# Patient Record
Sex: Female | Born: 1985 | Race: White | Marital: Single
Health system: Northeastern US, Academic
[De-identification: ages and names within clinical notes are randomized; demographics above are authoritative.]

---

## 2016-05-04 ENCOUNTER — Ambulatory Visit (HOSPITAL_BASED_OUTPATIENT_CLINIC_OR_DEPARTMENT_OTHER): Admitting: Family Medicine

## 2016-05-04 NOTE — Progress Notes (Signed)
* * *        **  Gina Case**    ------    5 Y old Female, DOB: May 11, 1986    [address] [place], [place] [address]    Home: [phone]    Provider: Alfonso Ramus        * * *    Telephone Encounter    ---    Answered by    Theresa Duty    Date: 05/04/2016        Time: 03:45 PM    Caller    Pt    ------            Reason    returning            Message                      Returning patient. pt will get prior records and update PCP w/[facility]. pt will pick up new pt records at [facility]. left practice before due to insurance issues. not taking any meds. has some issues w/mentstrual cycle. was going to [place]. wants to return now that her insurance is straightened out.                 Action Taken    Sorn,Sokha 05/04/2016 3:49:23 PM > new pt appt on 05-14-16  Gabino Hagin B 05/04/2016 9:16:54 PM > Noted                * * *                ---          * * *          Patient: Case, Gina R DOB: September 25, 1985 Provider: Peggye Form B  05/04/2016    ---    Note generated by eClinicalWorks EMR/PM Software ([email or web address])

## 2016-05-07 ENCOUNTER — Ambulatory Visit: Admitting: Family

## 2016-05-07 LAB — HX TSH REFLEX PANEL (RECOMMENDED)
CASE NUMBER: 2017299001415
HX 3RD GEN TSH: 0.595 u[IU]/mL — NL (ref 0.358–3.74)

## 2016-05-08 ENCOUNTER — Ambulatory Visit: Admitting: Family

## 2016-05-14 ENCOUNTER — Ambulatory Visit (HOSPITAL_BASED_OUTPATIENT_CLINIC_OR_DEPARTMENT_OTHER): Admitting: Family Medicine

## 2016-05-14 NOTE — Progress Notes (Signed)
* * *        **Gina Case, Gina Case**    ------    19 Y old Female, DOB: [birth year]    Account Number: [id number]    [address] [place], [place]    Home: [phone]    Guarantor: [employer] Insurance: [employer] - [facility]  [facility] Payer ID: [id number]    External Visit ID: [id number]    Appointment Facility: [facility]        * * *    05/14/2016    Progress Notes: Sadi Arave    ------    ---        **Current Medications**    ---    Discontinued      * Ocella     ---    * Flexeril     ---    * Vicoprofen     ---    * naproxen 500 mg tablet 1 tab(s) orally 2 times a day     ---    * amitriptyline 25 mg tablet 1 tabs orally qhs     ---    * Medication List reviewed and reconciled with the patient     ---      **Past Medical History**    ---      Healthy    ---      **Surgical History**    ---      wisdom teeth    ---      **Family History**    ---      Father: alive    ---    Mother: alive    ---    Sibling 1: alive, brother    ---    1 brother(s) - healthy.    ---      **Social History**    ---    no Smoking  Status: never smoker  , Patient counseled on the dangers of  tobacco use: 05/14/2016.    Seat belt: yes.    Alcohol: socially.    no Drug use.    Exercise: yes.    Marital Status: single.    Children: none.    Occupation: in school, works PT.    Home smoke detector use: yes.    Sexual orientation: heterosexual.    Sexually active: yes.      **Gyn History**    ---    Date of Last Period 3-4 months ago, bled every day sonce that time.      **Allergies**    ---      N.K.D.A.    ---      **Hospitalization/Major Diagnostic Procedure**    ---      No Hospitalization History.    ---      **Review of Systems**    ---    _CONSTITUTIONAL_ :    no Fever. no Chills. no Loss of Appetite.    _OPTHALMOLOGY_ :    no Change in Vision.    _RESPIRATORY_ :    no Shortness of Breath. no DOE (dyspnea on exertion).    _CARDIOLOGY_ :    no Chest Pain.    _GASTROENTEROLOGY_ :    no  Abdominal Pain. no Nausea. no Vomiting.    _FEMALE REPRODUCTIVE_ :    Abnormal vaginal bleeding yes.    _MUSCULOSKELETAL_ :    Positive for  neck pain  .    _NEUROLOGY_ :    no Headaches. no Weakness. no Tingling/Numbness. no Speech Abnormality.  _DERMATOLOGY_ :    no Rash.            **Reason for Appointment**    ---      1\. Returning pt. pt aware that if records are not received prior to appt,  it will get canceled.    ---      **History of Present Illness**    ---    _Sick Visit_ :    Patient is a 30 year old female returning patient presents with neck pain.  Ongoing for few years worsening in the last 6 months. Nothing make it worse.  Cracking make it better. Tight. Down to the shoulder more on the left.  Stretching make it better. Heat pads and Ibuprofen helps sometimes. Denies  numbness and tingling of the upper extremities except with the tip of the  right 4th finger when it get cold.Denies stress more than usual. Works as  Leisure centre manager.    Patient reports of continuous vaginal bleeding in the last 4 months. Went to  [facility] and had U/S done. Got thryoid checked as well.      **Vital Signs**    ---    HR 86, BP 118/72, O2 Sat. 99, Wt 182, Wt Change 52 lb.      **Examination**    ---    Denton Meek Examination_ :    General Appearance:  alert and oriented  NAD  .    HEENT:  moist mucous membranes  PERRLA  EOMI  .    Neck, Thyroid :  Mild tenderness on the back of the neck and the side towards  the trapezius muscle.  Marland Kitchen    Heart:  RRR, normal S1S2  no murmurs  .    Lungs:  clear to auscultation bilaterally  no wheezes/rhonchi/rales  .    Extremities  no edema  .    Skin:  no rash or skin lesions  .    Neurologic Exam:  No focal neurologic deficit  .          **Assessments**    ---    1\. 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 Abnormal uterine bleeding - N93.9    ---    3\. Left ovarian cyst - N83.20    ---      **Treatment**    ---      **1\. Chronic neck pain**    Notes: Likely muscle  spasm. Declines muscle relaxant. Moist heat. NSAID.  Stretching exercises- hand out given. If not better will refer to physical  therapist. Patient Educated with: Neck Spasms.pdf (Neck Spasms.pdf).    ---        **2\. Abnormal uterine bleeding**    Notes: She will f/u with [facility]. The ultrasound showed normal uterus  likely hormonal imbalance. Thyroid test is normal.        **3\. Left ovarian cyst**    Notes: Septated cystic lesion in left ovary seen on US done on 05/08/16. Will  repeat in 8-12 weeks.      **Follow Up**    ---    prn    Electronically signed by Peggye Form on 05/14/2016 at 07:57 PM EDT    Sign off status: Completed        * * [facility]    [address]    [place], [place] [address]    Tel: [phone]    Fax: [phone]              * * *  Patient: Gina Case, Gina Case DOB: April 20, 1986 Progress Note: Ewan Grau  05/14/2016    ---    Note generated by eClinicalWorks EMR/PM Software ([email or web address])

## 2016-05-18 ENCOUNTER — Ambulatory Visit

## 2016-06-18 ENCOUNTER — Ambulatory Visit: Admitting: Family

## 2016-06-22 ENCOUNTER — Ambulatory Visit

## 2016-07-01 ENCOUNTER — Ambulatory Visit

## 2016-07-09 ENCOUNTER — Ambulatory Visit (HOSPITAL_BASED_OUTPATIENT_CLINIC_OR_DEPARTMENT_OTHER): Admitting: Family Medicine

## 2016-07-09 ENCOUNTER — Encounter

## 2016-07-09 NOTE — Progress Notes (Signed)
* * *        **  Awilda Metro**    ------    44 Y old Female, DOB: [birth year]    [address] [place], [place] [address]    Home: [phone]    Provider: Alfonso Ramus        * * *    Telephone Encounter    ---    Answered by    Sharyn Dross    Date: 07/09/2016        Time: 04:28 PM    Reason    medical records    ------            Action Taken    Marion-Hussey,Laura 07/09/2016 4:41:11 PM > Entered info to  VV from previous records.                * * *              * * *        ---        Reason for Appointment    ---      1\. Medical records    ---      Current Medications    ---      None    ---      Past Medical History    ---      Right distal tib-fib fracture 2016    ---    LGSIL 04/30/2015    ---      Gyn History    ---    Sexual activity currently sexually active.    Last pap smear date 03/03/2016 Negative.    Abnormal pap smear LGSIL 2016- colpo 06/2015- no diagnostic abnormality  recognized.    Date of Last Period 3-4 months ago, bled every day sonce that time.    Birth control vasectomy by partner.      OB History    ---    Total pregnancies 2\.    Total living children 0\.    Abortion(s) 2\.    Pregnancy # 1: D&C.    Pregnancy # 2: D&C.          * * *          Patient: AILIN, ROCHFORD DOB: 05-Jun-1986 Provider: Peggye Form B  07/09/2016    ---    Note generated by eClinicalWorks EMR/PM Software ([email or web address])

## 2016-09-28 ENCOUNTER — Ambulatory Visit (HOSPITAL_BASED_OUTPATIENT_CLINIC_OR_DEPARTMENT_OTHER): Admitting: Family Medicine

## 2016-09-28 NOTE — Progress Notes (Signed)
* * *        **  Awilda Metro**    ------    46 Y old Female, DOB: [birth year]    [address], [place], [place] [address]    Home: [phone]    Provider: Alfonso Ramus        * * *    Telephone Encounter    ---    Answered by    Peggye Form B    Date: 09/28/2016        Time: 09:45 AM    Reason    LGSIL-04/29/15    ------            Action Taken    Braydee Shimkus B 09/28/2016 9:45:30 AM > Colposcopy done  06/09/15- Normal. Rpt pap 03/02/16- normal                * * *                ---          * * *          Patient: Sokolow, Belina R DOB: Dec 05, 1985 Provider: Peggye Form B  09/28/2016    ---    Note generated by eClinicalWorks EMR/PM Software ([email or web address])

## 2016-11-04 ENCOUNTER — Ambulatory Visit

## 2017-02-18 ENCOUNTER — Encounter (HOSPITAL_BASED_OUTPATIENT_CLINIC_OR_DEPARTMENT_OTHER): Payer: Self-pay | Admitting: Emergency Medicine

## 2017-02-18 ENCOUNTER — Emergency Department (HOSPITAL_BASED_OUTPATIENT_CLINIC_OR_DEPARTMENT_OTHER)
Admission: EM | Admit: 2017-02-18 | Discharge: 2017-02-18 | Disposition: A | Discharge status: Home / Self Care | Payer: BLUE CROSS/BLUE SHIELD | Attending: Emergency Medicine | Admitting: Emergency Medicine

## 2017-02-18 DIAGNOSIS — R140 Abdominal distension (gaseous): Secondary | ICD-10-CM | POA: Diagnosis not present

## 2017-02-18 LAB — URINALYSIS, ROUTINE W REFLEX MICROSCOPIC
BILIRUBIN URINE: NEGATIVE
GLUCOSE, UA: NEGATIVE mg/dL
HGB URINE DIPSTICK: NEGATIVE
Ketones, ur: NEGATIVE mg/dL
Nitrite: NEGATIVE
PH: 5 (ref 5.0–8.0)
Protein, ur: NEGATIVE mg/dL
Specific Gravity, Urine: 1.025 (ref 1.005–1.030)

## 2017-02-18 LAB — URINALYSIS, MICROSCOPIC (REFLEX)

## 2017-02-18 LAB — PREGNANCY, URINE: Preg Test, Ur: NEGATIVE

## 2017-02-18 MED ORDER — GI COCKTAIL ~~LOC~~
30.0000 mL | Freq: Once | ORAL | Status: AC
  Administered 2017-02-18: 30 mL via ORAL
  Filled 2017-02-18: qty 30

## 2017-02-18 NOTE — ED Triage Notes (Signed)
Pt reports abd pain, feeling bloated, burping a lot today. Pt reports 3 BM today which relieves the pain for a short time.

## 2017-02-18 NOTE — ED Notes (Signed)
ED Provider at bedside. 

## 2017-02-18 NOTE — ED Provider Notes (Signed)
[facility]   CSN: [id number] Arrival date & time: 02/18/17  1714     History   Chief Complaint Chief Complaint  Patient presents with  . Bloated    HPI Anna Mayer is a 31 y.o. female without significant PMHx, Presenting with acute onset of abdominal bloating that began this morning. She states she has been belching and passing gas throughout the day. She states she has been having normal bowel movements, 3 times today. She denies abdominal pain, N/V, D/C fever, heartburn, urinary symptoms, vaginal bleeding or discharge, blood in stool. LMP July 27. No history of abdominal surgery.  The history is provided by the patient.    History reviewed. No pertinent past medical history.  There are no active problems to display for this patient.   History reviewed. No pertinent surgical history.  OB History    No data available       Home Medications    Prior to Admission medications   Not on File    Family History No family history on file.  Social History Social History  Substance Use Topics  . Smoking status: Never Smoker  . Smokeless tobacco: Never Used  . Alcohol use No     Allergies   Penicillins   Review of Systems Review of Systems  Constitutional: Negative for appetite change, chills and fever.  HENT: Negative for trouble swallowing.   Respiratory: Negative for choking and shortness of breath.   Gastrointestinal: Positive for abdominal distention. Negative for abdominal pain, blood in stool, constipation, diarrhea, nausea and vomiting.  Genitourinary: Negative for dysuria, frequency, vaginal bleeding and vaginal discharge.  Musculoskeletal: Negative for back pain.  Skin: Negative for color change.  All other systems reviewed and are negative.    Physical Exam Updated Vital Signs BP (!) 140/98 (BP Location: Right Arm)   Pulse 66   Temp 99.1 F (37.3 C) (Oral)   Resp 16   Ht 5\' 2"  (1.575 m)   Wt 114.3 kg (252 lb)    LMP 02/05/2017   SpO2 100%   BMI 46.09 kg/m   Physical Exam  Constitutional: She appears well-developed and well-nourished. No distress.  Morbidly obese. Well-appearing.  HENT:  Head: Normocephalic and atraumatic.  Mouth/Throat: Oropharynx is clear and moist.  Eyes: Conjunctivae are normal.  Neck: Normal range of motion.  Cardiovascular: Normal rate, regular rhythm, normal heart sounds and intact distal pulses.   Pulmonary/Chest: Effort normal and breath sounds normal.  Abdominal: Soft. Bowel sounds are normal. She exhibits no mass. There is no tenderness. There is no rigidity, no rebound, no guarding and no CVA tenderness. No hernia.  Difficult to assess distention due to patient's body habitus.  Neurological: She is alert.  Skin: Skin is warm.  Psychiatric: She has a normal mood and affect. Her behavior is normal.  Nursing note and vitals reviewed.    ED Treatments / Results  Labs (all labs ordered are listed, but only abnormal results are displayed) Labs Reviewed  URINALYSIS, ROUTINE W REFLEX MICROSCOPIC - Abnormal; Notable for the following:       Result Value   APPearance CLOUDY (*)    Leukocytes, UA SMALL (*)    All other components within normal limits  URINALYSIS, MICROSCOPIC (REFLEX) - Abnormal; Notable for the following:    Bacteria, UA MANY (*)    Squamous Epithelial / LPF 0-5 (*)    All other components within normal limits  PREGNANCY, URINE    EKG  EKG Interpretation  None       Radiology No results found.  Procedures Procedures (including critical care time)  Medications Ordered in ED Medications  gi cocktail (Maalox,Lidocaine,Donnatal) (30 mLs Oral Given 02/18/17 1955)     Initial Impression / Assessment and Plan / ED Course  I have reviewed the triage vital signs and the nursing notes.  Pertinent labs & imaging results that were available during my care of the patient were reviewed by me and considered in my medical decision making (see  chart for details).     Patient presenting with abdominal bloating and associated belching/flatulence. Patient is nontoxic, nonseptic appearing, in no apparent distress. Abdominal exam is benign; no tenderness or peritoneal signs. No pelvic complaints. GI cocktail given in ED.  Patient discharged home with symptomatic treatment and given instructions for follow-up with their primary care physician. Return precautions discussed. Pt safe for discharge.  Discussed results, findings, treatment and follow up. Patient advised of return precautions. Patient verbalized understanding and agreed with plan.  Final Clinical Impressions(s) / ED Diagnoses   Final diagnoses:  Abdominal bloating    New Prescriptions New Prescriptions   No medications on file     Russo, [place] N, PA-C 02/18/17 2004    Tilden Fossa, MD 02/19/17 0111

## 2017-02-18 NOTE — ED Notes (Signed)
Pt verbalizes understanding of d/c instructions and denies any further needs at this time. 

## 2017-02-18 NOTE — Discharge Instructions (Signed)
Please read instructions below.  Begin taking Zantac every 12 hours as needed for abdominal discomfort.  Schedule an appointment with your primary care provider if symptoms persist. Return to the ER for fever, abdominal pain, if you stop having bowel movements, or for new or concerning symptoms.

## 2017-06-02 ENCOUNTER — Emergency Department (HOSPITAL_BASED_OUTPATIENT_CLINIC_OR_DEPARTMENT_OTHER)
Admission: EM | Admit: 2017-06-02 | Discharge: 2017-06-03 | Disposition: A | Discharge status: Home / Self Care | Payer: BLUE CROSS/BLUE SHIELD | Attending: Emergency Medicine | Admitting: Emergency Medicine

## 2017-06-02 ENCOUNTER — Other Ambulatory Visit: Payer: Self-pay

## 2017-06-02 ENCOUNTER — Encounter: Payer: Self-pay | Admitting: Emergency Medicine

## 2017-06-02 DIAGNOSIS — H00012 Hordeolum externum right lower eyelid: Secondary | ICD-10-CM | POA: Insufficient documentation

## 2017-06-02 DIAGNOSIS — H02842 Edema of right lower eyelid: Secondary | ICD-10-CM | POA: Diagnosis present

## 2017-06-02 NOTE — ED Notes (Signed)
Rt lower eye lid swelling and soreness onset Monday denies inj  No diff w vision

## 2017-06-02 NOTE — ED Triage Notes (Addendum)
PResents with right lower eyelid swelling and redness that began Monday with slight swelling and went down after taking benadryl and eye drops. The swelling became worse this AM. SHe has been using warm wash cloths which helped with the soreness but not the swelling. Denies fevers, blurry vsion and headache. Deniies eye drainage. C/o lower eyelid soreness. EOMs intact, no loss of vision.

## 2017-06-03 MED ORDER — CEPHALEXIN 500 MG PO CAPS: 500.0000 mg | ORAL_CAPSULE | Freq: Four times a day (QID) | ORAL | 0 refills | Status: DC

## 2017-06-03 MED ORDER — CEPHALEXIN 250 MG PO CAPS
500.0000 mg | ORAL_CAPSULE | Freq: Once | ORAL | Status: AC
  Administered 2017-06-03: 500 mg via ORAL
  Filled 2017-06-03: qty 2

## 2017-06-03 NOTE — Discharge Instructions (Signed)
Please continue warm compresses.  You may alternate Tylenol 1000 mg every 6 hours as needed for pain and Ibuprofen 800 mg every 8 hours as needed for pain.  Please take Ibuprofen with food.

## 2017-06-03 NOTE — ED Provider Notes (Signed)
TIME SEEN: 12:21 AM  CHIEF COMPLAINT: Right lower eyelid swelling  HPI: Patient is a 31 year old female with no significant past medical history who presents to the emergency department with right lower eyelid swelling for the past several days.  Has tried warm compresses without relief.  No drainage.  No vision changes, eye pain, redness, drainage.  Has had increased hearing.  Does not wear contacts or glasses.  Does not have an ophthalmologist or optometrist.  No injury to the face.  No pain with movement of the eyes.  ROS: See HPI Constitutional: no fever  Eyes: no drainage  ENT: no runny nose   Cardiovascular:  no chest pain  Resp: no SOB  GI: no vomiting GU: no dysuria Integumentary: no rash  Allergy: no hives  Musculoskeletal: no leg swelling  Neurological: no slurred speech ROS otherwise negative  PAST MEDICAL HISTORY/PAST SURGICAL HISTORY:  History reviewed. No pertinent past medical history.  MEDICATIONS:  Prior to Admission medications   Not on File    ALLERGIES:  Allergies  Allergen Reactions  . Penicillins     SOCIAL HISTORY:  Social History   Tobacco Use  . Smoking status: Never Smoker  . Smokeless tobacco: Never Used  Substance Use Topics  . Alcohol use: No    FAMILY HISTORY: History reviewed. No pertinent family history.  EXAM: BP 121/71 (BP Location: Left Arm)   Pulse 80   Temp 98.4 F (36.9 C) (Oral)   Resp 18   LMP 05/13/2017 (Approximate)   SpO2 100%  CONSTITUTIONAL: Alert and oriented and responds appropriately to questions. Well-appearing; well-nourished HEAD: Normocephalic EYES: Conjunctivae clear, pupils appear equal, EOMI, normal visual acuity, no drainage, patient has a stye to the inner aspect of the right lower eyelid with some dependent edema and small amount of erythema underneath the eyelid, no drainage of the stye, no hyphema, no subconjunctival hemorrhage, globe appears intact ENT: normal nose; moist mucous membranes NECK:  Supple, no meningismus, no nuchal rigidity, no LAD  CARD: RRR; S1 and S2 appreciated; no murmurs, no clicks, no rubs, no gallops RESP: Normal chest excursion without splinting or tachypnea; breath sounds clear and equal bilaterally; no wheezes, no rhonchi, no rales, no hypoxia or respiratory distress, speaking full sentences ABD/GI: Normal bowel sounds; non-distended; soft, non-tender, no rebound, no guarding, no peritoneal signs, no hepatosplenomegaly BACK:  The back appears normal and is non-tender to palpation, there is no CVA tenderness EXT: Normal ROM in all joints; non-tender to palpation; no edema; normal capillary refill; no cyanosis, no calf tenderness or swelling    SKIN: Normal color for age and race; warm; no rash NEURO: Moves all extremities equally PSYCH: The patient's mood and manner are appropriate. Grooming and personal hygiene are appropriate.  MEDICAL DECISION MAKING: Patient here with a stye to the right lower eyelid.  Have recommended continuing warm compresses and alternating Tylenol Motrin.  The eye itself is not involved.  Given she does have some lower erythema and warmth underneath the margin of the eyelid consistent with some possible very mild cellulitis versus just dependent changes from gravity, will discharge on short course of Keflex.  Doubt post septal cellulitis.  Given ophthalmology follow-up.  Discussed return precautions.  At this time, I do not feel there is any life-threatening condition present. I have reviewed and discussed all results (EKG, imaging, lab, urine as appropriate) and exam findings with patient/family. I have reviewed nursing notes and appropriate previous records.  I feel the patient is safe to be  discharged home without further emergent workup and can continue workup as an outpatient as needed. Discussed usual and customary return precautions. Patient/family verbalize understanding and are comfortable with this plan.  Outpatient follow-up has been  provided if needed. All questions have been answered.      Shundra Wirsing, Layla MawKristen N, DO 06/03/17 [id number]

## 2017-06-03 NOTE — ED Notes (Signed)
Pt verbalizes understanding of d/c instructions and denies any further needs at this time. 

## 2017-11-25 ENCOUNTER — Emergency Department (HOSPITAL_BASED_OUTPATIENT_CLINIC_OR_DEPARTMENT_OTHER)
Admission: EM | Admit: 2017-11-25 | Discharge: 2017-11-25 | Disposition: A | Discharge status: Home / Self Care | Payer: BLUE CROSS/BLUE SHIELD | Attending: Emergency Medicine | Admitting: Emergency Medicine

## 2017-11-25 ENCOUNTER — Encounter (HOSPITAL_BASED_OUTPATIENT_CLINIC_OR_DEPARTMENT_OTHER): Payer: Self-pay | Admitting: Adult Health

## 2017-11-25 ENCOUNTER — Other Ambulatory Visit: Payer: Self-pay

## 2017-11-25 DIAGNOSIS — R102 Pelvic and perineal pain unspecified side: Secondary | ICD-10-CM

## 2017-11-25 DIAGNOSIS — Z79899 Other long term (current) drug therapy: Secondary | ICD-10-CM | POA: Insufficient documentation

## 2017-11-25 LAB — WET PREP, GENITAL
Clue Cells Wet Prep HPF POC: NONE SEEN
SPERM: NONE SEEN
TRICH WET PREP: NONE SEEN
Yeast Wet Prep HPF POC: NONE SEEN

## 2017-11-25 LAB — URINALYSIS, ROUTINE W REFLEX MICROSCOPIC
Bilirubin Urine: NEGATIVE
Glucose, UA: NEGATIVE mg/dL
Ketones, ur: NEGATIVE mg/dL
Leukocytes, UA: NEGATIVE
Nitrite: NEGATIVE
PROTEIN: NEGATIVE mg/dL
pH: 5.5 (ref 5.0–8.0)

## 2017-11-25 LAB — URINALYSIS, MICROSCOPIC (REFLEX)

## 2017-11-25 LAB — PREGNANCY, URINE: PREG TEST UR: NEGATIVE

## 2017-11-25 MED ORDER — AZITHROMYCIN 250 MG PO TABS
500.0000 mg | ORAL_TABLET | Freq: Once | ORAL | Status: AC
  Administered 2017-11-25: 500 mg via ORAL
  Filled 2017-11-25: qty 2

## 2017-11-25 MED ORDER — GENTAMICIN SULFATE 40 MG/ML IJ SOLN
240.0000 mg | Freq: Once | INTRAMUSCULAR | Status: DC
  Filled 2017-11-25: qty 6

## 2017-11-25 NOTE — Discharge Instructions (Addendum)
You have results pending.  If they are positive, you receive a phone call.  If they are negative, you will not receive a phone call.  Either way, the results will be posted in MyChart.  If results are positive for gonorrhea, you will need to get treatment. If they are positive only for chlamydia, you do not need further treatment, as you were treated today. Make sure you are drinking water and staying well-hydrated. Using creams, douches, or otherwise altering the environment of your vagina, as this can cause worsening symptoms. Follow up with women's health or the health department for further evaluation if your symptoms are not improving. Return to the emergency room if you develop high fevers, vomiting, persistent abdominal pain, or any new or concerning symptoms.

## 2017-11-25 NOTE — ED Provider Notes (Signed)
[facility] EMERGENCY DEPARTMENT Provider Note   CSN: [id number] Arrival date & time: 11/25/17  1331     History   Chief Complaint Chief Complaint  Patient presents with  . Vaginal Pain    HPI Anna Mayer is a 32 y.o. female presenting for evaluation of vaginal pain.  Patient states she has had 4 days of vaginal irritation.  This is worse when she urinates and when she ambulates.  She denies history of similar.  She denies vaginal discharge.  She reports a fever of 102 yesterday, which resolved with 1 dose of Motrin and has not returned.  She denies chest pain, shortness breath, nausea, vomiting, abdominal pain.  She denies dysuria or hematuria.  She is sexually active with one female partner, they use condoms.  She is not on other birth control.  She does not have an OB/GYN, nor ever had a pelvic exam. Period ended 2 days ago.  She has no medical problems, does not take medications daily.  HPI  History reviewed. No pertinent past medical history.  There are no active problems to display for this patient.   History reviewed. No pertinent surgical history.   OB History   None      Home Medications    Prior to Admission medications   Medication Sig Start Date End Date Taking? Authorizing Provider  cephALEXin (KEFLEX) 500 MG capsule Take 1 capsule (500 mg total) by mouth 4 (four) times daily. 06/03/17   Ward, Layla Maw, DO    Family History History reviewed. No pertinent family history.  Social History Social History   Tobacco Use  . Smoking status: Never Smoker  . Smokeless tobacco: Never Used  Substance Use Topics  . Alcohol use: No  . Drug use: No     Allergies   Penicillins   Review of Systems Review of Systems  Genitourinary: Positive for vaginal pain.  All other systems reviewed and are negative.    Physical Exam Updated Vital Signs BP (!) 129/95 (BP Location: Right Arm)   Pulse 81   Temp (!) 97.5 F (36.4 C) (Oral)   Resp 18    Ht  (1.575 m)   Wt 120.2 kg (265 lb)   LMP 11/11/2017   SpO2 100%   BMI 48.47 kg/m   Physical Exam  Constitutional: She is oriented to person, place, and time. She appears well-developed and well-nourished. No distress.  Resting comfortably in no distress  HENT:  Head: Normocephalic and atraumatic.  Eyes: Pupils are equal, round, and reactive to light. Conjunctivae and EOM are normal.  Neck: Normal range of motion. Neck supple.  Cardiovascular: Normal rate, regular rhythm and intact distal pulses.  Pulmonary/Chest: Effort normal and breath sounds normal. No respiratory distress. She has no wheezes.  Abdominal: Soft. She exhibits no distension and no mass. There is no tenderness. There is no guarding.  Genitourinary: Rectum normal, vagina normal and uterus normal. Pelvic exam was performed with patient supine. There is no rash, tenderness, lesion or injury on the right labia. There is no rash, tenderness, lesion or injury on the left labia. Cervix exhibits discharge. Cervix exhibits no motion tenderness and no friability. Right adnexum displays no mass, no tenderness and no fullness. Left adnexum displays no mass, no tenderness and no fullness.  Genitourinary Comments: Chaperone present.  No obvious lesions.  Thin discharge on exam.  No CMT or adnexal tenderness.  Musculoskeletal: Normal range of motion.  Neurological: She is alert and oriented to person,  place, and time.  Skin: Skin is warm and dry.  Psychiatric: She has a normal mood and affect.  Nursing note and vitals reviewed.    ED Treatments / Results  Labs (all labs ordered are listed, but only abnormal results are displayed) Labs Reviewed  WET PREP, GENITAL - Abnormal; Notable for the following components:      Result Value   WBC, Wet Prep HPF POC MANY (*)    All other components within normal limits  URINALYSIS, ROUTINE W REFLEX MICROSCOPIC - Abnormal; Notable for the following components:   APPearance TURBID (*)      Specific Gravity, Urine >1.030 (*)    Hgb urine dipstick MODERATE (*)    All other components within normal limits  URINALYSIS, MICROSCOPIC (REFLEX) - Abnormal; Notable for the following components:   Bacteria, UA RARE (*)    All other components within normal limits  PREGNANCY, URINE  GC/CHLAMYDIA PROBE AMP ([place]) NOT AT [facility]    EKG None  Radiology No results found.  Procedures Procedures (including critical care time)  Medications Ordered in ED Medications  azithromycin (ZITHROMAX) tablet 500 mg (500 mg Oral Given 11/25/17 1608)     Initial Impression / Assessment and Plan / ED Course  I have reviewed the triage vital signs and the nursing notes.  Pertinent labs & imaging results that were available during my care of the patient were reviewed by me and considered in my medical decision making (see chart for details).     Pt presenting for evaluation of vaginal irritation.  UA without infection.  GU exam shows discharge without adnexal tenderness or CMT.  Doubt PID.  Wet prep positive for white cells without yeast, trichomoniasis, or clue cells.  Gonorrhea and chlamydia sent.  Discussed findings with patient.  Patient elects for treatment for possible STD, however reports an anaphylactic-like reaction to penicillins.  Will give azithromycin and hold off on Rocephin at this time until results have returned.  Discussed with patient that if gonorrhea is positive, she will need treatment.  Information given for follow-up with health department or women's health.  At this time, patient appears safe for discharge.  Return precautions given.  Patient states she understands and agrees to plan.   Final Clinical Impressions(s) / ED Diagnoses   Final diagnoses:  Vaginal pain    ED Discharge Orders    None       Alveria Apley, PA-C 11/25/17 1640    Tegeler, Canary Brim, MD 11/25/17 [phone]

## 2017-11-25 NOTE — ED Triage Notes (Signed)
PResents with "vaginal rash that burns when I urinate and blisters to my rectum, it also hurts when I walk a little" Denies unprotected sexual activity. LAst menstrual period 2 weeks ago.

## 2017-11-26 LAB — GC/CHLAMYDIA PROBE AMP (~~LOC~~) NOT AT ARMC
CHLAMYDIA, DNA PROBE: NEGATIVE
Neisseria Gonorrhea: NEGATIVE

## 2017-11-30 ENCOUNTER — Encounter (HOSPITAL_BASED_OUTPATIENT_CLINIC_OR_DEPARTMENT_OTHER): Payer: Self-pay | Admitting: *Deleted

## 2017-11-30 ENCOUNTER — Emergency Department (HOSPITAL_BASED_OUTPATIENT_CLINIC_OR_DEPARTMENT_OTHER)
Admission: EM | Admit: 2017-11-30 | Discharge: 2017-11-30 | Disposition: A | Discharge status: Home / Self Care | Payer: BLUE CROSS/BLUE SHIELD | Attending: Emergency Medicine | Admitting: Emergency Medicine

## 2017-11-30 ENCOUNTER — Other Ambulatory Visit: Payer: Self-pay

## 2017-11-30 DIAGNOSIS — R509 Fever, unspecified: Secondary | ICD-10-CM | POA: Insufficient documentation

## 2017-11-30 NOTE — ED Triage Notes (Signed)
Pt reports seen here on 5/16 for dysuria and "burning". Per pt she got 2 pills, but could not get a shot for it here due to her pcn allergy. Pt reports burning and pain has resolved, but she is running fevers up to 103 at night. Pt denies any pain at this time. Given urine cup and instructions for cc urine specimen, will notify staff when able to void.

## 2017-11-30 NOTE — ED Provider Notes (Signed)
[facility] EMERGENCY DEPARTMENT Provider Note   CSN: [id number] Arrival date & time: 11/30/17  4401     History   Chief Complaint Chief Complaint  Patient presents with  . Fever    HPI Anna Mayer is a 32 y.o. female.patient was seen here 11/25/2017 for "vaginal burning "and dysuria. She was treated with azithromycin. She has since become asymptomatic with the exception that she developed a fever with temperatures between 101 and 103three days ago.. She denies chills denies dysuria denies vaginal burning she is presently asymptomatic no abdominal pain no headache no cough no sore throat or chest pain no dysuria and no diarrhea. Treated herself with ibuprofen 5 AM today. She presently feels well. GC and chlamydia specimens were obtained 11/25/2017 which are negative  HPI  History reviewed. No pertinent past medical history.  There are no active problems to display for this patient.   History reviewed. No pertinent surgical history.   OB History   None      Home Medications    Prior to Admission medications   Medication Sig Start Date End Date Taking? Authorizing Provider  cephALEXin (KEFLEX) 500 MG capsule Take 1 capsule (500 mg total) by mouth 4 (four) times daily. 06/03/17   Ward, Layla Maw, DO    Family History History reviewed. No pertinent family history.  Social History Social History   Tobacco Use  . Smoking status: Never Smoker  . Smokeless tobacco: Never Used  Substance Use Topics  . Alcohol use: No  . Drug use: No     Allergies   Penicillins   Review of Systems Review of Systems  Constitutional: Positive for fever.  HENT: Negative.   Respiratory: Negative.   Cardiovascular: Negative.   Gastrointestinal: Negative.   Musculoskeletal: Negative.   Skin: Negative.   Neurological: Negative.   Psychiatric/Behavioral: Negative.   All other systems reviewed and are negative.    Physical Exam Updated Vital Signs BP 123/79    Pulse 80   Temp 99.6 F (37.6 C) (Oral)   Resp 18   Ht  (1.575 m)   Wt 120.2 kg (265 lb)   LMP 11/11/2017   SpO2 100%   BMI 48.47 kg/m   Physical Exam  Constitutional: She appears well-developed and well-nourished. No distress.  HENT:  Head: Normocephalic and atraumatic.  Right Ear: External ear normal.  Left Ear: External ear normal.  Mouth/Throat: Oropharynx is clear and moist.  Bilateral tympanic membranes normal  Eyes: Pupils are equal, round, and reactive to light. Conjunctivae are normal.  Neck: Neck supple. No tracheal deviation present. No thyromegaly present.  Cardiovascular: Normal rate, regular rhythm and normal heart sounds.  No murmur heard. Pulmonary/Chest: Effort normal and breath sounds normal.  Abdominal: Soft. Bowel sounds are normal. She exhibits no distension. There is no tenderness.  obese  Musculoskeletal: Normal range of motion. She exhibits no edema or tenderness.  Neurological: She is alert. Coordination normal.  Skin: Skin is warm and dry. Capillary refill takes less than 2 seconds. No rash noted.  Psychiatric: She has a normal mood and affect.  Nursing note and vitals reviewed.    ED Treatments / Results  Labs (all labs ordered are listed, but only abnormal results are displayed) Labs Reviewed - No data to display  EKG None  Radiology No results found.  Procedures Procedures (including critical care time)  Medications Ordered in ED Medications - No data to display   Initial Impression / Assessment and Plan / ED  Course  I have reviewed the triage vital signs and the nursing notes.  Pertinent labs & imaging results that were available during my care of the patient were reviewed by me and considered in my medical decision making (see chart for details).     Patient well-appearing and asymptomatic. No further diagnostic testing needed. She is in agreement. Plan antipyretics. Home observation. Return precautions given. Referral  primary care Final Clinical Impressions(s) / ED Diagnoses   Final diagnoses:  Fever, unspecified fever cause    ED Discharge Orders    None       Doug Sou, MD 11/30/17 [phone]

## 2017-11-30 NOTE — Discharge Instructions (Signed)
Take Tylenol or ibuprofen as directed for fever higher than 100.4. Is not necessary to wake up at night to check temperature. Return if concern for any reason or see an urgent care center. Call the number on these instructions to get a primary care physician

## 2018-03-28 ENCOUNTER — Encounter (HOSPITAL_BASED_OUTPATIENT_CLINIC_OR_DEPARTMENT_OTHER): Payer: Self-pay | Admitting: Emergency Medicine

## 2018-03-28 ENCOUNTER — Emergency Department (HOSPITAL_BASED_OUTPATIENT_CLINIC_OR_DEPARTMENT_OTHER)
Admission: EM | Admit: 2018-03-28 | Discharge: 2018-03-28 | Disposition: A | Discharge status: Home / Self Care | Payer: BLUE CROSS/BLUE SHIELD | Attending: Emergency Medicine | Admitting: Emergency Medicine

## 2018-03-28 ENCOUNTER — Other Ambulatory Visit: Payer: Self-pay

## 2018-03-28 DIAGNOSIS — R600 Localized edema: Secondary | ICD-10-CM | POA: Diagnosis present

## 2018-03-28 DIAGNOSIS — L03211 Cellulitis of face: Secondary | ICD-10-CM | POA: Diagnosis not present

## 2018-03-28 MED ORDER — SULFAMETHOXAZOLE-TRIMETHOPRIM 800-160 MG PO TABS: 1.0000 | ORAL_TABLET | Freq: Two times a day (BID) | ORAL | 0 refills | Status: AC

## 2018-03-28 NOTE — ED Provider Notes (Signed)
[facility] EMERGENCY DEPARTMENT Provider Note   CSN: [id number] Arrival date & time: 03/28/18  4098     History   Chief Complaint Chief Complaint  Patient presents with  . Skin Problem    HPI Anna Mayer is a 32 y.o. female.  HPI  32 year old female presents with left facial/chin swelling.  Started 2 days ago.  She typically gets increased acne with her menstrual cycles and thought it originally was this but now it has become a little more painful.  The pain is not severe and is mild in intensity.  Has seen a white spot on the bump every once in a while and has tried some warm compresses.  No fevers, trouble breathing, or severe pain. No history of MRSA.  No past medical history on file.  There are no active problems to display for this patient.   No past surgical history on file.   OB History   None      Home Medications    Prior to Admission medications   Not on File    Family History No family history on file.  Social History Social History   Tobacco Use  . Smoking status: Never Smoker  . Smokeless tobacco: Never Used  Substance Use Topics  . Alcohol use: No  . Drug use: No     Allergies   Penicillins   Review of Systems Review of Systems  Constitutional: Negative for fever.  HENT: Positive for facial swelling.   Skin: Negative for wound.     Physical Exam Updated Vital Signs BP 126/66 (BP Location: Right Arm)   Pulse 84   Temp 98.1 F (36.7 C) (Oral)   Resp 18   Ht 5\' 2"  (1.575 m)   Wt 117.9 kg   LMP 03/22/2018   SpO2 99%   BMI 47.55 kg/m   Physical Exam  Constitutional: She appears well-developed and well-nourished. No distress.  HENT:  Head: Normocephalic.    Right Ear: External ear normal.  Left Ear: External ear normal.  Nose: Nose normal.  Eyes: Right eye exhibits no discharge. Left eye exhibits no discharge.  Pulmonary/Chest: Effort normal.  Neurological: She is alert.  Skin: Skin is warm and dry.  She is not diaphoretic.  Nursing note and vitals reviewed.    ED Treatments / Results  Labs (all labs ordered are listed, but only abnormal results are displayed) Labs Reviewed - No data to display  EKG None  Radiology No results found.  Procedures Procedures (including critical care time)  EMERGENCY DEPARTMENT US SOFT TISSUE INTERPRETATION "Study: Limited Soft Tissue Ultrasound"  INDICATIONS: Pain and Soft tissue infection Multiple views of the body part were obtained in real-time with a multi-frequency linear probe  PERFORMED BY: Myself IMAGES ARCHIVED?: Yes SIDE:Left BODY PART:chin INTERPRETATION:  No abcess noted and Cellulitis present   Medications Ordered in ED Medications - No data to display   Initial Impression / Assessment and Plan / ED Course  I have reviewed the triage vital signs and the nursing notes.  Pertinent labs & imaging results that were available during my care of the patient were reviewed by me and considered in my medical decision making (see chart for details).     Patient's area of tenderness could be an ingrown hair.  No significant fluid collection besides a very small possible superficial fluid collection seen on ultrasound.  Otherwise appears to have a little bit of inflammation/cellulitis.  I discussed risks/benefits of needle aspiration versus I&D versus  antibiotics and warm compresses.  We discussed that antibody sometimes cannot penetrate if it is an abscess.  However if it is an abscess is quite small.  Thus we will try antibiotics and warm compresses at her request.  We discussed return precautions.  Final Clinical Impressions(s) / ED Diagnoses   Final diagnoses:  Facial cellulitis    ED Discharge Orders    None       Pricilla LovelessGoldston, Cleona Doubleday, MD 03/28/18 [phone]

## 2018-03-28 NOTE — ED Notes (Signed)
ED Provider at bedside. 

## 2018-03-28 NOTE — ED Triage Notes (Signed)
Pt has swollen area to left side of her chin that started yesterday.  No known insect bite, no tweezing or shaving.  No new products on face.  No itching but is a little sore.

## 2018-03-28 NOTE — Discharge Instructions (Signed)
If the area becomes larger, more painful, or if you develop fevers, vomiting, or any other new/concerning symptoms and return to the ER for evaluation.  Otherwise take the antibiotics and use warm compresses.

## 2018-07-16 ENCOUNTER — Other Ambulatory Visit: Payer: Self-pay

## 2018-07-16 ENCOUNTER — Encounter (HOSPITAL_BASED_OUTPATIENT_CLINIC_OR_DEPARTMENT_OTHER): Payer: Self-pay | Admitting: *Deleted

## 2018-07-16 ENCOUNTER — Emergency Department (HOSPITAL_BASED_OUTPATIENT_CLINIC_OR_DEPARTMENT_OTHER): Payer: BLUE CROSS/BLUE SHIELD

## 2018-07-16 ENCOUNTER — Emergency Department (HOSPITAL_BASED_OUTPATIENT_CLINIC_OR_DEPARTMENT_OTHER)
Admission: EM | Admit: 2018-07-16 | Discharge: 2018-07-16 | Disposition: A | Discharge status: Home / Self Care | Payer: BLUE CROSS/BLUE SHIELD | Attending: Emergency Medicine | Admitting: Emergency Medicine

## 2018-07-16 DIAGNOSIS — M549 Dorsalgia, unspecified: Secondary | ICD-10-CM

## 2018-07-16 DIAGNOSIS — M545 Low back pain: Secondary | ICD-10-CM | POA: Insufficient documentation

## 2018-07-16 DIAGNOSIS — M546 Pain in thoracic spine: Secondary | ICD-10-CM | POA: Insufficient documentation

## 2018-07-16 DIAGNOSIS — Z041 Encounter for examination and observation following transport accident: Secondary | ICD-10-CM | POA: Insufficient documentation

## 2018-07-16 LAB — PREGNANCY, URINE: PREG TEST UR: NEGATIVE

## 2018-07-16 MED ORDER — ACETAMINOPHEN 500 MG PO TABS: 500.0000 mg | ORAL_TABLET | Freq: Four times a day (QID) | ORAL | 0 refills | Status: AC | PRN

## 2018-07-16 MED ORDER — NAPROXEN 500 MG PO TABS: 500.0000 mg | ORAL_TABLET | Freq: Two times a day (BID) | ORAL | 0 refills | Status: AC

## 2018-07-16 MED ORDER — METHOCARBAMOL 500 MG PO TABS: 500.0000 mg | ORAL_TABLET | Freq: Two times a day (BID) | ORAL | 0 refills | Status: AC
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
## 2018-07-16 NOTE — ED Notes (Signed)
Patient is resting comfortably. 
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
## 2019-10-14 ENCOUNTER — Emergency Department: Admit: 2019-10-14 | Disposition: A | Discharge status: Home / Self Care | Source: Home / Self Care

## 2019-12-06 ENCOUNTER — Ambulatory Visit: Admitting: Internal Medicine

## 2019-12-06 LAB — HX .AUTOMATED DIFF
CASE NUMBER: 2021146001167
HX ABSOLUTE BASO COUNT: 0.05 10*3/uL — NL (ref 0.0–0.22)
HX ABSOLUTE EOS COUNT: 0.3 10*3/uL — NL (ref 0.0–0.45)
HX ABSOLUTE LYMPHS COUNT: 1.91 10*3/uL — NL (ref 0.74–5.04)
HX ABSOLUTE MONO COUNT: 0.61 10*3/uL — NL (ref 0.0–1.34)
HX ABSOLUTE NEUTRO COUNT: 4.74 10*3/uL — NL (ref 1.48–7.95)
HX BASOPHILS: 0.7 %
HX EOSINOPHILS: 3.9 %
HX IMMATURE GRANULOCYTES: 0.3 % — NL (ref 0.0–2.0)
HX LYMPHOCYTES: 25 %
HX MONOCYTES: 8 %
HX NEUTROPHILS: 62.1 %

## 2019-12-06 LAB — HX CBC W/ DIFF
CASE NUMBER: 2021146001167
HX ABSOLUTE NRBC COUNT: 0 10*3/uL
HX HCT: 46.5 % — NL (ref 36.0–47.0)
HX HGB: 15.7 g/dL — NL (ref 11.8–16.0)
HX MCH: 31.7 pg — NL (ref 26.0–34.0)
HX MCHC: 33.8 g/dL — NL (ref 31.0–37.0)
HX MCV: 93.8 fL — NL (ref 80.0–100.0)
HX MPV: 10.2 fL — NL (ref 9.4–12.4)
HX NRBC PERCENT: 0 % — NL
HX PLATELET: 258 10*3/uL — NL (ref 150.0–400.0)
HX RBC: 4.96 10*6/uL — NL (ref 3.9–5.2)
HX RDW-CV: 11.8 % — NL (ref 11.5–14.5)
HX RDW-SD: 40.5 fL — NL (ref 35.0–51.0)
HX WBC: 7.6 10*3/uL — NL (ref 3.7–11.2)

## 2019-12-06 LAB — HX GLOMERULAR FILTRATION RATE (ESTIMATED)
CASE NUMBER: 2021146001167
HX AFN AMER GLOMERULAR FILTRATION RATE: >90
HX NON-AFN AMER GLOMERULAR FILTRATION RATE: >90

## 2019-12-06 LAB — HX COMPREHENSIVE METABOLIC PANEL
CASE NUMBER: 2021146001167
HX ALBUMIN LVL: 3.8 g/dL — NL (ref 3.2–5.0)
HX ALKALINE PHOSPHATASE: 88 U/L — NL (ref 30.0–117.0)
HX ALT: 226 U/L — ABNORMAL HIGH (ref 6.0–55.0)
HX ANION GAP: 5 — NL (ref 3.0–11.0)
HX AST: 203 U/L — ABNORMAL HIGH (ref 6.0–40.0)
HX BILIRUBIN TOTAL: 0.6 mg/dL — NL (ref 0.2–1.2)
HX BUN: 16 mg/dL — NL (ref 6.0–20.0)
HX CALCIUM LVL: 9 mg/dL — NL (ref 8.5–10.5)
HX CHLORIDE: 105 mmol/L — NL (ref 98.0–110.0)
HX CO2: 30 mmol/L — NL (ref 21.0–32.0)
HX CREATININE: 0.668 mg/dL — NL (ref 0.55–1.3)
HX GLUCOSE LVL: 105 mg/dL — NL (ref 70.0–110.0)
HX POTASSIUM LVL: 4.4 mmol/L — NL (ref 3.6–5.2)
HX SODIUM LVL: 140 mmol/L — NL (ref 136.0–146.0)
HX TOTAL PROTEIN: 7.3 g/dL — NL (ref 6.0–8.4)

## 2019-12-06 LAB — HX VITAMIN D 25 HYDROXY LEVEL (RECOMMENDED)
CASE NUMBER: 2021146001167
HX VITAMIN D 25 OH LVL: 6 ng/mL — ABNORMAL LOW (ref 30.0–100.0)

## 2019-12-06 LAB — HX URINALYSIS (COMPLETE)
CASE NUMBER: 2021146001168
HX UA BILIRUBIN: NEGATIVE — NL
HX UA BLOOD: NEGATIVE — NL
HX UA GLUCOSE: NEGATIVE — NL
HX UA KETONES: NEGATIVE — NL
HX UA LEUKOCYTE ESTERASE: NEGATIVE — NL
HX UA NITRITE: NEGATIVE — NL
HX UA PH: 5 — NL (ref 5.0–8.0)
HX UA PROTEIN: NEGATIVE — NL
HX UA RBC: 1 /HPF — NL (ref 0.0–2.0)
HX UA SPECIFIC GRAVITY: 1.021 — NL (ref 1.003–1.03)
HX UA SQUAMOUS EPITHELIAL: 8 /HPF — ABNORMAL HIGH (ref 0.0–5.0)
HX UA UROBILINOGEN: NEGATIVE — NL
HX UA WBC: 1 /HPF — NL (ref 0.0–5.0)

## 2019-12-06 LAB — HX LIPID PANEL
CASE NUMBER: 2021146001167
HX CHOL: 249 mg/dL — ABNORMAL HIGH
HX HDL: 60 mg/dL — NL
HX LDL: 163 mg/dL — ABNORMAL HIGH
HX TRIG: 129 mg/dL — NL

## 2019-12-13 ENCOUNTER — Ambulatory Visit: Admitting: Internal Medicine

## 2019-12-15 LAB — HX HPV HIGH RISK BY TMA
CASE NUMBER: 2021153004338
HX HPV HIGH RISK BY TMA: NOT DETECTED

## 2019-12-19 LAB — HX GYN FINAL REPORT
CASE NUMBER: 0
HX FINAL CYTOLOGIC INTERPRETATION: NEGATIVE
HX STATEMENT OF ADEQUACY: ABSENT

## 2020-10-03 ENCOUNTER — Emergency Department (HOSPITAL_BASED_OUTPATIENT_CLINIC_OR_DEPARTMENT_OTHER): Payer: BLUE CROSS/BLUE SHIELD

## 2020-10-03 ENCOUNTER — Encounter (HOSPITAL_BASED_OUTPATIENT_CLINIC_OR_DEPARTMENT_OTHER): Payer: Self-pay | Admitting: Emergency Medicine

## 2020-10-03 ENCOUNTER — Other Ambulatory Visit: Payer: Self-pay

## 2020-10-03 ENCOUNTER — Emergency Department (HOSPITAL_BASED_OUTPATIENT_CLINIC_OR_DEPARTMENT_OTHER)
Admission: EM | Admit: 2020-10-03 | Discharge: 2020-10-03 | Disposition: A | Discharge status: Home / Self Care | Payer: BLUE CROSS/BLUE SHIELD | Attending: Emergency Medicine | Admitting: Emergency Medicine

## 2020-10-03 DIAGNOSIS — R110 Nausea: Secondary | ICD-10-CM | POA: Insufficient documentation

## 2020-10-03 DIAGNOSIS — D72829 Elevated white blood cell count, unspecified: Secondary | ICD-10-CM | POA: Insufficient documentation

## 2020-10-03 DIAGNOSIS — R1030 Lower abdominal pain, unspecified: Secondary | ICD-10-CM | POA: Insufficient documentation

## 2020-10-03 LAB — PREGNANCY, URINE: Preg Test, Ur: NEGATIVE

## 2020-10-03 LAB — CBC WITH DIFFERENTIAL/PLATELET
Abs Immature Granulocytes: 0.07 10*3/uL (ref 0.00–0.07)
Basophils Absolute: 0.1 10*3/uL (ref 0.0–0.1)
Basophils Relative: 1 %
Eosinophils Absolute: 0.1 10*3/uL (ref 0.0–0.5)
Eosinophils Relative: 1 %
HCT: 41.2 % (ref 36.0–46.0)
Hemoglobin: 13.4 g/dL (ref 12.0–15.0)
Immature Granulocytes: 1 %
Lymphocytes Relative: 21 %
Lymphs Abs: 2.5 10*3/uL (ref 0.7–4.0)
MCH: 22.8 pg — ABNORMAL LOW (ref 26.0–34.0)
MCHC: 32.5 g/dL (ref 30.0–36.0)
MCV: 69.9 fL — ABNORMAL LOW (ref 80.0–100.0)
Monocytes Absolute: 1 10*3/uL (ref 0.1–1.0)
Monocytes Relative: 8 %
Neutro Abs: 8.1 10*3/uL — ABNORMAL HIGH (ref 1.7–7.7)
Neutrophils Relative %: 68 %
Platelets: 478 10*3/uL — ABNORMAL HIGH (ref 150–400)
RBC: 5.89 MIL/uL — ABNORMAL HIGH (ref 3.87–5.11)
RDW: 16 % — ABNORMAL HIGH (ref 11.5–15.5)
WBC: 11.8 10*3/uL — ABNORMAL HIGH (ref 4.0–10.5)
nRBC: 0 % (ref 0.0–0.2)

## 2020-10-03 LAB — URINALYSIS, ROUTINE W REFLEX MICROSCOPIC
Bilirubin Urine: NEGATIVE
Glucose, UA: NEGATIVE mg/dL
Hgb urine dipstick: NEGATIVE
Ketones, ur: NEGATIVE mg/dL
Leukocytes,Ua: NEGATIVE
Nitrite: NEGATIVE
Protein, ur: NEGATIVE mg/dL
Specific Gravity, Urine: 1.02 (ref 1.005–1.030)
pH: 6 (ref 5.0–8.0)

## 2020-10-03 LAB — COMPREHENSIVE METABOLIC PANEL
ALT: 14 U/L (ref 0–44)
AST: 15 U/L (ref 15–41)
Albumin: 4 g/dL (ref 3.5–5.0)
Alkaline Phosphatase: 54 U/L (ref 38–126)
Anion gap: 11 (ref 5–15)
BUN: 11 mg/dL (ref 6–20)
CO2: 23 mmol/L (ref 22–32)
Calcium: 9.4 mg/dL (ref 8.9–10.3)
Chloride: 101 mmol/L (ref 98–111)
Creatinine, Ser: 0.94 mg/dL (ref 0.44–1.00)
GFR, Estimated: >60 mL/min (ref >60)
Glucose, Bld: 93 mg/dL (ref 70–99)
Potassium: 3.8 mmol/L (ref 3.5–5.1)
Sodium: 135 mmol/L (ref 135–145)
Total Bilirubin: 0.6 mg/dL (ref 0.3–1.2)
Total Protein: 8.5 g/dL — ABNORMAL HIGH (ref 6.5–8.1)

## 2020-10-03 LAB — LIPASE, BLOOD: Lipase: 29 U/L (ref 11–51)

## 2020-10-03 MED ORDER — IOHEXOL 300 MG/ML  SOLN
100.0000 mL | Freq: Once | INTRAMUSCULAR | Status: AC | PRN
  Administered 2020-10-03: 100 mL via INTRAVENOUS

## 2020-10-03 NOTE — ED Notes (Signed)
Pt. A&O x4.  She is talking on cell phone.  Explained MD orders and POC.  [place] states she has never had an IV, CT scan or contrast.  She is afraid of needles.  Detailed education provided regarding IV insertion process and removal of needle.  She understands that this is necessary for the CT scan and will prevent future sticks if additional labs are needed.   Education provided regarding CT process.  Pt. States she is afraid of having an IV and a CT.  She acknowledges understanding these tests are to evaluate her lower abdominal pain.  She declined a physical assessment by RN.  She stated she would like to speak with the MD before going forward with any additional testing.  She remained on phone entire time talking with support person.   MD notified.

## 2020-10-03 NOTE — ED Triage Notes (Signed)
Reports lower abdominal pressure that started yesterday after eating [employer].  Had one normal bm but still having some pressure.  Did endorse some nausea relieved by pepto.

## 2020-10-03 NOTE — ED Notes (Signed)
Unable to obtain IV x2.

## 2020-10-03 NOTE — ED Provider Notes (Addendum)
[facility] EMERGENCY DEPARTMENT Provider Note   CSN: [id number] Arrival date & time: 10/03/20  1275     History Chief Complaint  Patient presents with  . Abdominal Pain    Anna Mayer is a 35 y.o. female.  Patient with the onset of lower quadrant abdominal pain starting yesterday morning at 11:00 after eating a [employer] sausage biscuit.  Been associated with nausea but no vomiting.  Patient did get some relief with Pepto-Bismol.  No diarrhea.  No dysuria.  No vaginal discharge.        History reviewed. No pertinent past medical history.  There are no problems to display for this patient.   History reviewed. No pertinent surgical history.   OB History   No obstetric history on file.     No family history on file.  Social History   Tobacco Use  . Smoking status: Never Smoker  . Smokeless tobacco: Never Used  Vaping Use  . Vaping Use: Never used  Substance Use Topics  . Alcohol use: No  . Drug use: No    Home Medications Prior to Admission medications   Medication Sig Start Date End Date Taking? Authorizing Provider  acetaminophen (TYLENOL) 500 MG tablet Take 1 tablet (500 mg total) by mouth every 6 (six) hours as needed. 07/16/18   Law, Waylan Boga, PA-C  methocarbamol (ROBAXIN) 500 MG tablet Take 1 tablet (500 mg total) by mouth 2 (two) times daily. 07/16/18   Law, Waylan Boga, PA-C  naproxen (NAPROSYN) 500 MG tablet Take 1 tablet (500 mg total) by mouth 2 (two) times daily. 07/16/18   Emi Holes, PA-C    Allergies    Penicillins  Review of Systems   Review of Systems  Constitutional: Negative for chills and fever.  HENT: Negative for rhinorrhea and sore throat.   Eyes: Negative for visual disturbance.  Respiratory: Negative for cough and shortness of breath.   Cardiovascular: Negative for chest pain and leg swelling.  Gastrointestinal: Positive for abdominal pain and nausea. Negative for diarrhea and vomiting.  Genitourinary:  Negative for dysuria and vaginal discharge.  Musculoskeletal: Negative for back pain and neck pain.  Skin: Negative for rash.  Neurological: Negative for dizziness, light-headedness and headaches.  Hematological: Does not bruise/bleed easily.  Psychiatric/Behavioral: Negative for confusion.    Physical Exam Updated Vital Signs BP 108/87 (BP Location: Left Arm)   Pulse 79   Temp 99.7 F (37.6 C) (Oral)   Resp 16   Ht 1.575 m (5\' 2" )   Wt 120.2 kg   LMP 09/18/2020 (Approximate)   SpO2 100%   BMI 48.47 kg/m   Physical Exam Vitals and nursing note reviewed.  Constitutional:      General: She is not in acute distress.    Appearance: She is well-developed.  HENT:     Head: Normocephalic and atraumatic.  Eyes:     Extraocular Movements: Extraocular movements intact.     Conjunctiva/sclera: Conjunctivae normal.     Pupils: Pupils are equal, round, and reactive to light.  Cardiovascular:     Rate and Rhythm: Normal rate and regular rhythm.     Heart sounds: No murmur heard.   Pulmonary:     Effort: Pulmonary effort is normal. No respiratory distress.     Breath sounds: Normal breath sounds.  Abdominal:     General: There is no distension.     Palpations: Abdomen is soft.     Tenderness: There is no abdominal tenderness.  Comments: Abdomen soft nontender  Musculoskeletal:        General: Normal range of motion.     Cervical back: Neck supple.  Skin:    General: Skin is warm and dry.     Capillary Refill: Capillary refill takes less than 2 seconds.  Neurological:     General: No focal deficit present.     Mental Status: She is alert and oriented to person, place, and time.     Cranial Nerves: No cranial nerve deficit.     Sensory: No sensory deficit.     Motor: No weakness.     ED Results / Procedures / Treatments   Labs (all labs ordered are listed, but only abnormal results are displayed) Labs Reviewed  CBC WITH DIFFERENTIAL/PLATELET - Abnormal; Notable for  the following components:      Result Value   WBC 11.8 (*)    RBC 5.89 (*)    MCV 69.9 (*)    MCH 22.8 (*)    RDW 16.0 (*)    Platelets 478 (*)    Neutro Abs 8.1 (*)    All other components within normal limits  COMPREHENSIVE METABOLIC PANEL - Abnormal; Notable for the following components:   Total Protein 8.5 (*)    All other components within normal limits  LIPASE, BLOOD  URINALYSIS, ROUTINE W REFLEX MICROSCOPIC  PREGNANCY, URINE    EKG None  Radiology CT Abdomen Pelvis W Contrast  Result Date: 10/03/2020 CLINICAL DATA:  Right-sided abdominal pain for 1 day EXAM: CT ABDOMEN AND PELVIS WITH CONTRAST TECHNIQUE: Multidetector CT imaging of the abdomen and pelvis was performed using the standard protocol following bolus administration of intravenous contrast. CONTRAST:  OMNIPAQUE IOHEXOL 300 MG/ML  SOLN COMPARISON:  None. FINDINGS: Lower chest: No acute abnormality. Hepatobiliary: No focal liver abnormality is seen. No gallstones, gallbladder wall thickening, or biliary dilatation. Pancreas: Unremarkable. No pancreatic ductal dilatation or surrounding inflammatory changes. Spleen: Normal in size without focal abnormality. Adrenals/Urinary Tract: Adrenal glands are within normal limits. Kidneys demonstrate a normal enhancement pattern bilaterally. No renal calculi or obstructive changes are seen. The ureters and bladder are within normal limits. Stomach/Bowel: No obstructive or inflammatory changes are noted within the colon. The appendix is within normal limits. No small bowel or gastric abnormality is noted. Vascular/Lymphatic: No significant vascular findings are present. No enlarged abdominal or pelvic lymph nodes. Reproductive: Uterus and bilateral adnexa are unremarkable. Other: No abdominal wall hernia or abnormality. No abdominopelvic ascites. Musculoskeletal: No acute or significant osseous findings. IMPRESSION: No acute abnormality noted. Electronically Signed   By: Alcide Clever  M.D.   On: 10/03/2020 13:11    Procedures Procedures   Medications Ordered in ED Medications  iohexol (OMNIPAQUE) 300 MG/ML solution 100 mL (100 mLs Intravenous Contrast Given 10/03/20 1231)    ED Course  I have reviewed the triage vital signs and the nursing notes.  Pertinent labs & imaging results that were available during my care of the patient were reviewed by me and considered in my medical decision making (see chart for details).    MDM Rules/Calculators/A&P                           Work-up for the lower quadrant abdominal pain that started yesterday morning at 1100 without any specific findings on the work-up White blood cell count slightly elevated at 11.8.  Liver function test and electrolytes without any significant abnormalities.  Urinalysis negative for  urinary tract infection.  Pregnancy test is negative.  CT scan without any acute findings to explain the pain.  Patient feels better.  No further nausea.  Will discharge home she will return for any new or worse symptoms.    Final Clinical Impression(s) / ED Diagnoses Final diagnoses:  Lower abdominal pain    Rx / DC Orders ED Discharge Orders    None       Vanetta Mulders, MD 10/03/20 1430    Vanetta Mulders, MD 10/03/20 1431    Vanetta Mulders, MD 10/03/20 1432

## 2020-10-03 NOTE — ED Notes (Signed)
Pt told provider that she was willing to get labs and CT scan at this time.  Primary nurse notified.

## 2020-10-03 NOTE — Discharge Instructions (Addendum)
Work-up for the abdominal pain without any acute findings.  CT scan negative.  Urinalysis negative.  Labs without specific abnormalities.  Return for any new or worse symptoms.  Can continue taking the Pepto-Bismol.

## 2021-01-22 ENCOUNTER — Ambulatory Visit: Payer: No Typology Code available for payment source | Attending: Internal Medicine

## 2021-01-22 ENCOUNTER — Ambulatory Visit: Payer: BLUE CROSS/BLUE SHIELD

## 2021-01-22 DIAGNOSIS — Z23 Encounter for immunization: Secondary | ICD-10-CM

## 2021-01-22 NOTE — Progress Notes (Signed)
   Covid-19 Vaccination Clinic  Name:  Anna Mayer    MRN: [id number] DOB: Nov 06, 1985  01/22/2021  Anna Mayer was observed post Covid-19 immunization for 15 minutes without incident. She was provided with Vaccine Information Sheet and instruction to access the V-Safe system.   Anna Mayer was instructed to call 911 with any severe reactions post vaccine: Difficulty breathing  Swelling of face and throat  A fast heartbeat  A bad rash all over body  Dizziness and weakness   Immunizations Administered     Name Date Dose VIS Date Route   PFIZER Comrnaty(Gray TOP) Covid-19 Vaccine 01/22/2021 10:02 AM 0.3 mL 06/20/2020 Intramuscular   Manufacturer: [employer], [employer]   Lot: [id number]   NDC: [phone]

## 2021-01-24 ENCOUNTER — Other Ambulatory Visit (HOSPITAL_BASED_OUTPATIENT_CLINIC_OR_DEPARTMENT_OTHER): Payer: Self-pay

## 2021-01-24 MED ORDER — COVID-19 MRNA VAC-TRIS(PFIZER) 30 MCG/0.3ML IM SUSP
INTRAMUSCULAR | 0 refills | Status: AC
  Filled 2021-01-24: qty 0.3, 1d supply, fill #0

## 2022-05-22 IMAGING — CT CT ABD-PELV W/ CM
2 of 4 series · 17 of 46 positions shown, 19 images · IV contrast (omnipaque)
Comparison: None.

CLINICAL DATA: Right-sided abdominal pain for 1 day

EXAM:
CT ABDOMEN AND PELVIS WITH CONTRAST
TECHNIQUE: Multidetector CT imaging of the abdomen and pelvis was performed
using the standard protocol following bolus administration of
intravenous contrast.
CONTRAST:  100mL OMNIPAQUE IOHEXOL 300 MG/ML  SOLN

[Series 2: axial st · axial · 0.98mm/px · z∈[-491,-86]mm · 14 of 91 slices shown, 16 images]
[im 5/91  soft-tissue]
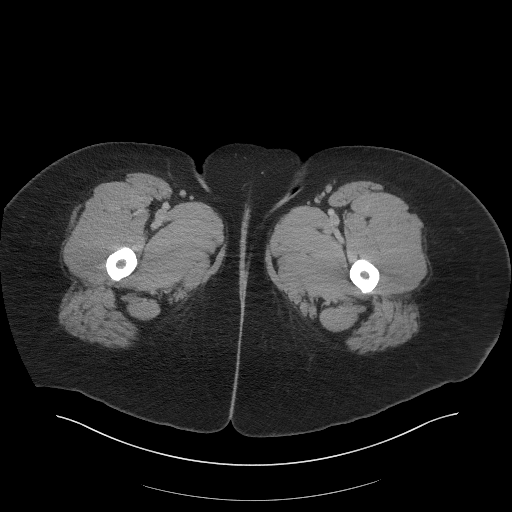
[im 5/91  bone]
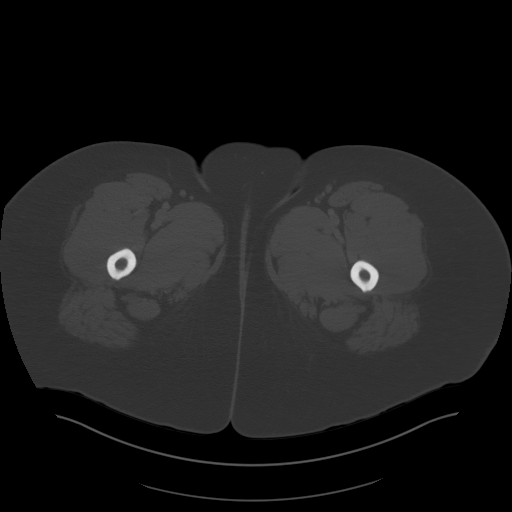
[im 13/91  soft-tissue]
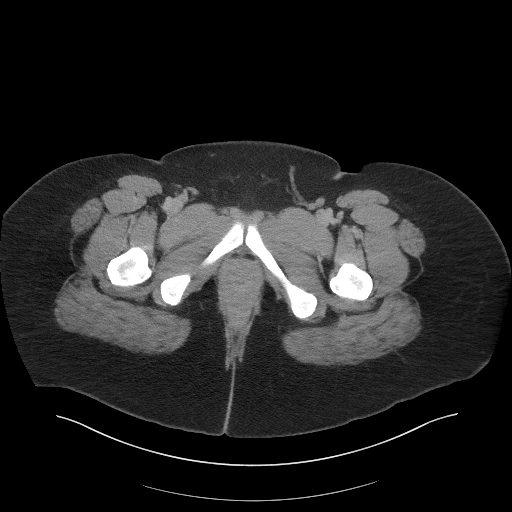
[im 17/91  soft-tissue]
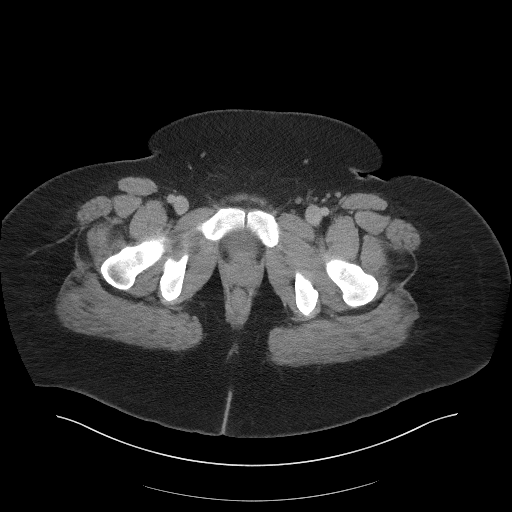
[im 25/91  soft-tissue]
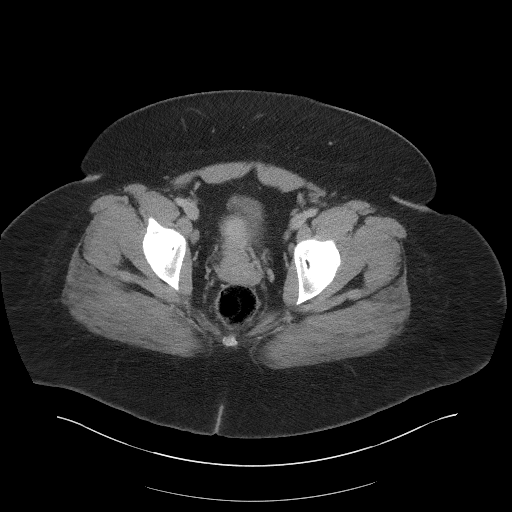
[im 29/91  soft-tissue]
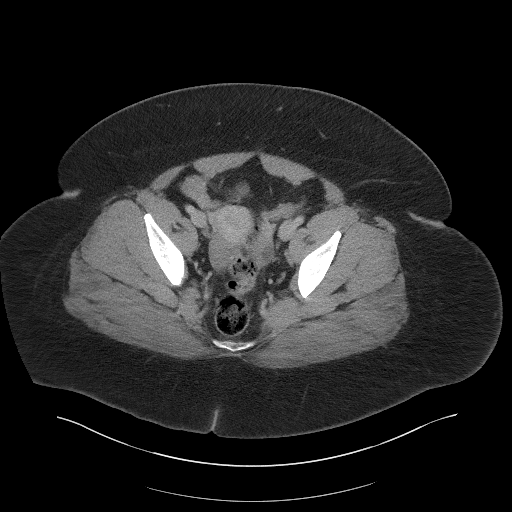
[im 37/91  soft-tissue]
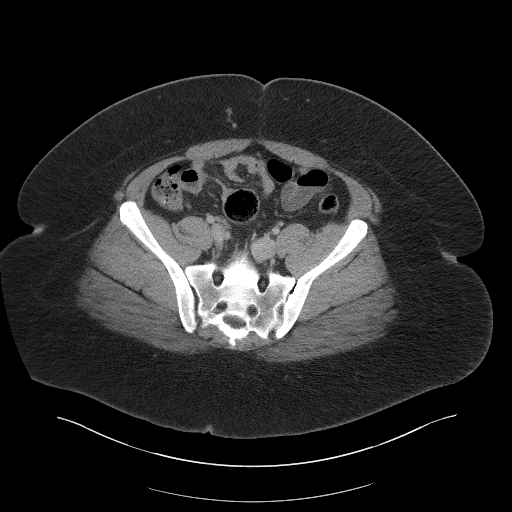
[im 41/91  soft-tissue]
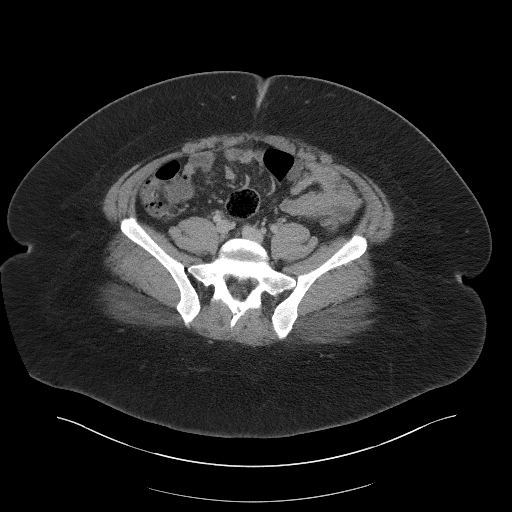
[im 50/91  soft-tissue]
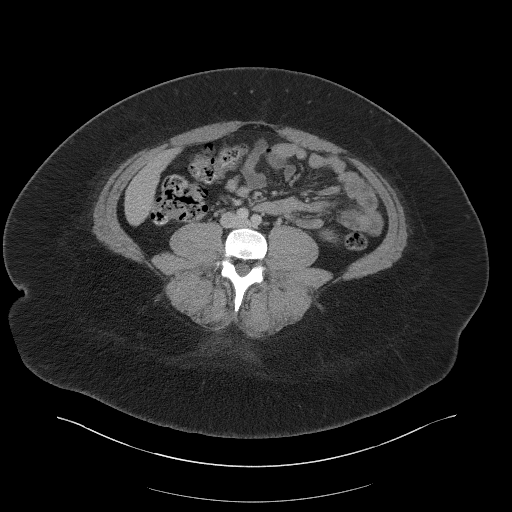
[im 54/91  soft-tissue]
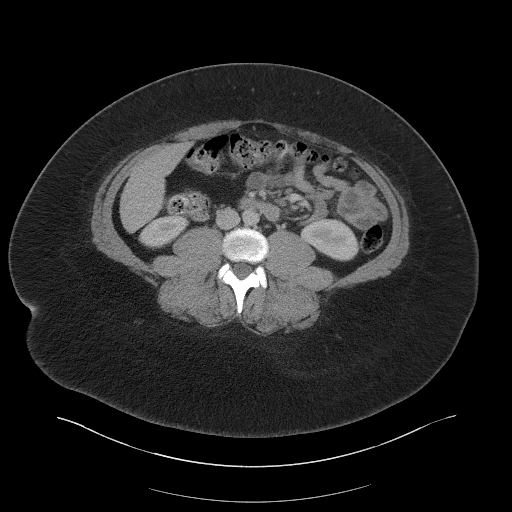
[im 54/91  bone]
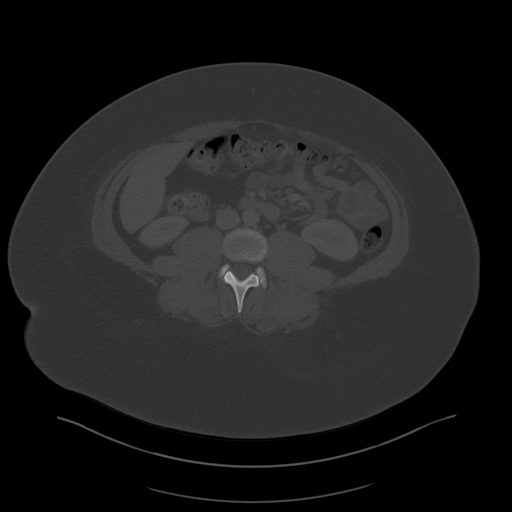
[im 62/91  soft-tissue]
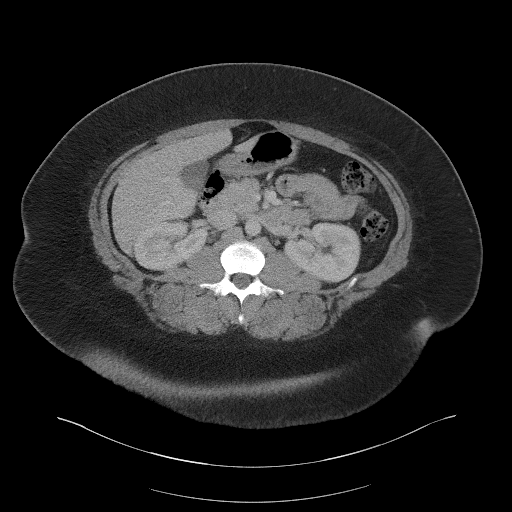
[im 66/91  soft-tissue]
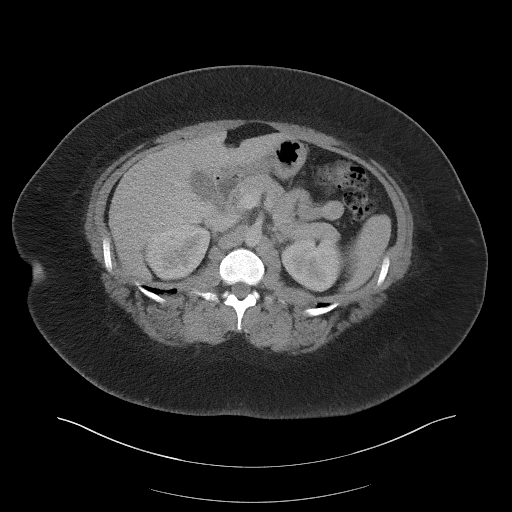
[im 74/91  soft-tissue]
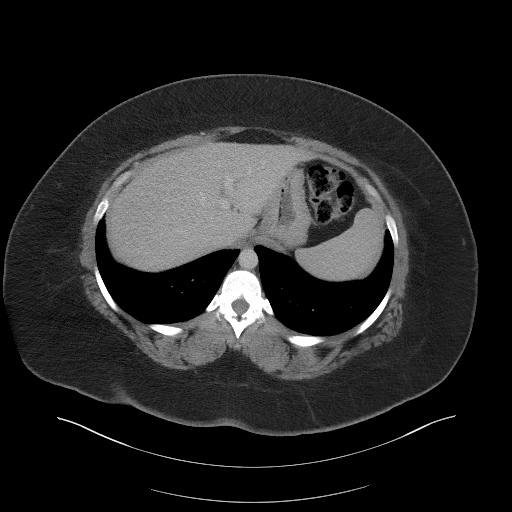
[im 78/91  soft-tissue]
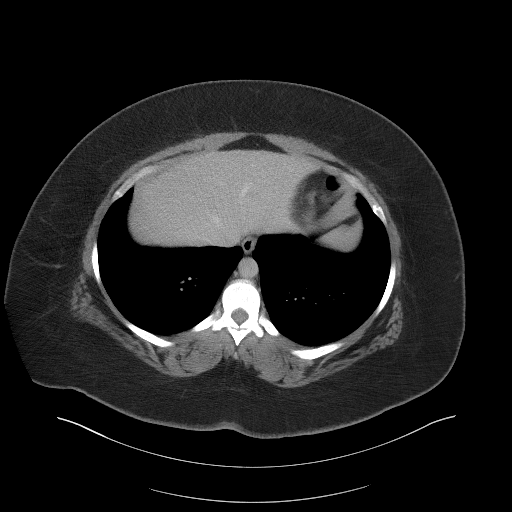
[im 86/91  soft-tissue]
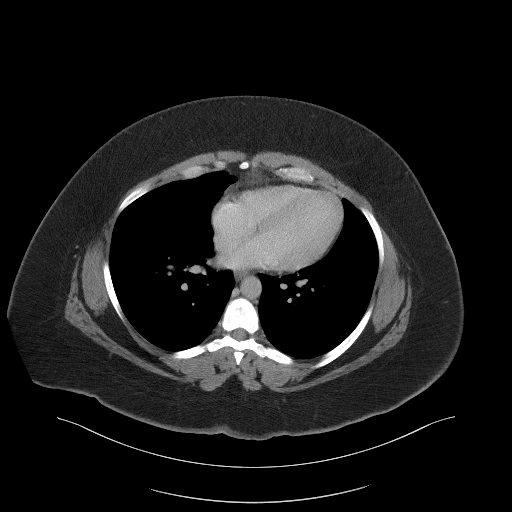

[Series 5: coronal st · coronal · 0.93mm/px · 3 of 122 slices shown]
[im 41/122  soft-tissue]
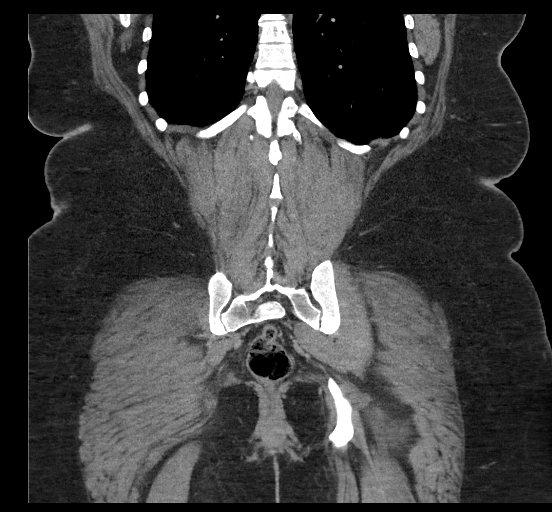
[im 54/122  soft-tissue]
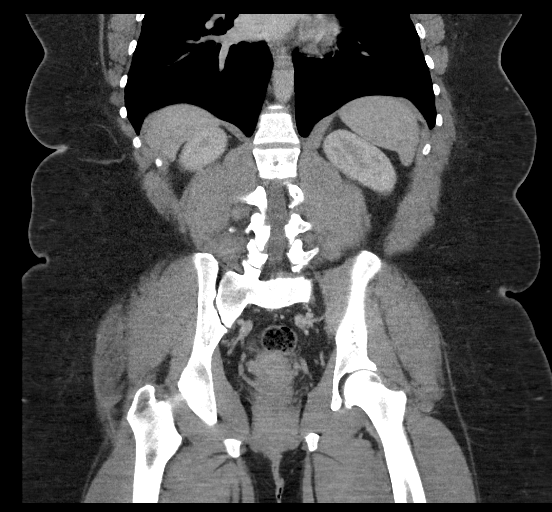
[im 68/122  soft-tissue]
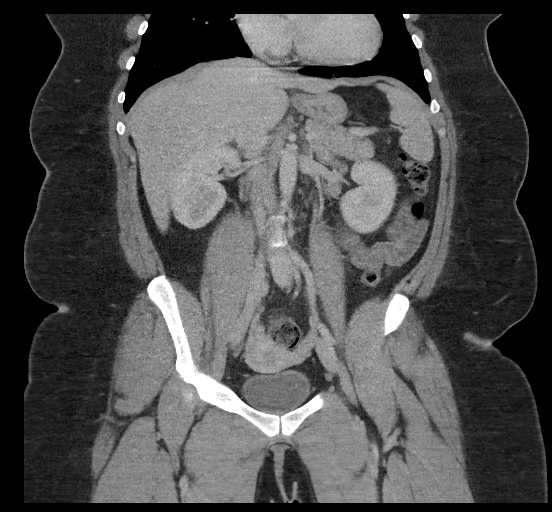

[17 of 46 positions shown; findings below may reference images not displayed]

FINDINGS: Lower chest: No acute abnormality.

Hepatobiliary: No focal liver abnormality is seen. No gallstones,
gallbladder wall thickening, or biliary dilatation.

Pancreas: Unremarkable. No pancreatic ductal dilatation or
surrounding inflammatory changes.

Spleen: Normal in size without focal abnormality.

Adrenals/Urinary Tract: Adrenal glands are within normal limits.
Kidneys demonstrate a normal enhancement pattern bilaterally. No
renal calculi or obstructive changes are seen. The ureters and
bladder are within normal limits.

Stomach/Bowel: No obstructive or inflammatory changes are noted
within the colon. The appendix is within normal limits. No small
bowel or gastric abnormality is noted.

Vascular/Lymphatic: No significant vascular findings are present. No
enlarged abdominal or pelvic lymph nodes.

Reproductive: Uterus and bilateral adnexa are unremarkable.

Other: No abdominal wall hernia or abnormality. No abdominopelvic
ascites.

Musculoskeletal: No acute or significant osseous findings.
IMPRESSION: No acute abnormality noted.

## 2022-12-30 ENCOUNTER — Other Ambulatory Visit: Payer: Self-pay

## 2022-12-30 ENCOUNTER — Emergency Department (HOSPITAL_BASED_OUTPATIENT_CLINIC_OR_DEPARTMENT_OTHER)
Admission: EM | Admit: 2022-12-30 | Discharge: 2022-12-30 | Disposition: A | Discharge status: Home / Self Care | Payer: BLUE CROSS/BLUE SHIELD | Attending: Emergency Medicine | Admitting: Emergency Medicine

## 2022-12-30 DIAGNOSIS — H6123 Impacted cerumen, bilateral: Secondary | ICD-10-CM | POA: Insufficient documentation

## 2022-12-30 MED ORDER — CARBAMIDE PEROXIDE 6.5 % OT SOLN: 5.0000 [drp] | Freq: Two times a day (BID) | OTIC | 0 refills | Status: AC

## 2022-12-30 NOTE — Discharge Instructions (Addendum)
Please follow-up with your primary care doctor, we attempted to irrigate your ear and have had some success but you still have a lot of wax in your ears.  Use the Debrox as prescribed.  Return to the ER if you have severe ear pain.

## 2022-12-30 NOTE — ED Notes (Signed)
Verbal consent for treatment due to signature pad not working. 

## 2022-12-30 NOTE — ED Triage Notes (Signed)
Pt thinks she might have a build of wax in her right ear. Wants it looked at.

## 2022-12-30 NOTE — ED Provider Notes (Signed)
[facility] DEPARTMENT AT [facility] Provider Note   CSN: [id number] Arrival date & time: 12/30/22  1711     History  Chief Complaint  Patient presents with   Ear Fullness    Anna Mayer is a 37 y.o. female, no pertinent past medical history, who presents to the ED secondary to fullness in the right ear, she states she cannot hear out of it, and feels like there is wax building up in it.  Denies any pain, no recent illnesses.    Home Medications Prior to Admission medications   Medication Sig Start Date End Date Taking? Authorizing Provider  carbamide peroxide (DEBROX) 6.5 % OTIC solution Place 5 drops into both ears 2 (two) times daily for 4 days. 12/30/22 01/03/23 Yes Jauan Wohl L, PA  acetaminophen (TYLENOL) 500 MG tablet Take 1 tablet (500 mg total) by mouth every 6 (six) hours as needed. 07/16/18   Law, Waylan Boga, PA-C  COVID-19 mRNA Vac-TriS, Pfizer, SUSP injection Inject into the muscle. 01/22/21   Judyann Munson, MD  methocarbamol (ROBAXIN) 500 MG tablet Take 1 tablet (500 mg total) by mouth 2 (two) times daily. 07/16/18   Law, Waylan Boga, PA-C  naproxen (NAPROSYN) 500 MG tablet Take 1 tablet (500 mg total) by mouth 2 (two) times daily. 07/16/18   Emi Holes, PA-C      Allergies    Penicillins    Review of Systems   Review of Systems  HENT:  Positive for ear discharge. Negative for ear pain.     Physical Exam Updated Vital Signs BP (!) 142/102 (BP Location: Right Arm)   Pulse 83   Temp 98.8 F (37.1 C) (Oral)   Resp 18   Ht 5\' 2"  (1.575 m)   Wt 120.2 kg   SpO2 99%   BMI 48.47 kg/m  Physical Exam Vitals and nursing note reviewed.  Constitutional:      General: She is not in acute distress.    Appearance: She is well-developed.  HENT:     Head: Normocephalic and atraumatic.     Right Ear: There is impacted cerumen.     Left Ear: There is impacted cerumen.  Eyes:     Conjunctiva/sclera: Conjunctivae normal.  Cardiovascular:      Rate and Rhythm: Normal rate and regular rhythm.     Heart sounds: No murmur heard. Pulmonary:     Effort: Pulmonary effort is normal. No respiratory distress.     Breath sounds: Normal breath sounds.  Abdominal:     Palpations: Abdomen is soft.     Tenderness: There is no abdominal tenderness.  Musculoskeletal:        General: No swelling.     Cervical back: Neck supple.  Skin:    General: Skin is warm and dry.     Capillary Refill: Capillary refill takes less than 2 seconds.  Neurological:     Mental Status: She is alert.  Psychiatric:        Mood and Affect: Mood normal.     ED Results / Procedures / Treatments   Labs (all labs ordered are listed, but only abnormal results are displayed) Labs Reviewed - No data to display  EKG None  Radiology No results found.  Procedures .Ear Cerumen Removal  Date/Time: 12/30/2022 8:19 PM  Performed by: Pete Pelt, PA Authorized by: Pete Pelt, PA   Consent:    Consent obtained:  Verbal   Consent given by:  Patient  Risks, benefits, and alternatives were discussed: yes     Risks discussed:  Bleeding, infection, dizziness, incomplete removal, TM perforation and pain   Alternatives discussed:  Alternative treatment Universal protocol:    Patient identity confirmed:  Verbally with patient Procedure details:    Location:  R ear   Procedure type: irrigation     Procedure outcomes: cerumen removed   Post-procedure details:    Inspection:  Bleeding and some cerumen remaining   Hearing quality:  Improved   Procedure completion:  Tolerated     Medications Ordered in ED Medications - No data to display  ED Course/ Medical Decision Making/ A&P                             Medical Decision Making Patient is a 37 year old female, here for decreased hearing in her right ear, she has a large amount of cerumen in her ear, I have the tech attempt to irrigate it, with little success, and then I irrigated it, and we  has a few large chunks of cerumen come out.  There is still some some cerumen remaining, so I discharged her with Debrox, and follow-up with primary care doctor.  She states her hearing is somewhat improved.    Risk OTC drugs.    Final Clinical Impression(s) / ED Diagnoses Final diagnoses:  Bilateral impacted cerumen    Rx / DC Orders ED Discharge Orders          Ordered    carbamide peroxide (DEBROX) 6.5 % OTIC solution  2 times daily        12/30/22 2015              Grisela Mesch, [place], PA 12/30/22 2020    Terrilee Files, MD 12/31/22 1002
